# Patient Record
Sex: Female | Born: 1980 | Race: Black or African American | Hispanic: No | Marital: Single | Smoking: Never smoker
Health system: Southern US, Community
[De-identification: ages and names within clinical notes are randomized; demographics above are authoritative.]

## PROBLEM LIST (undated history)

## (undated) HISTORY — PX: ANKLE SURGERY: SHX546

---

## 2004-12-23 ENCOUNTER — Emergency Department (HOSPITAL_COMMUNITY): Admission: EM | Admit: 2004-12-23 | Discharge: 2004-12-23 | Payer: Self-pay | Admitting: Emergency Medicine

## 2005-02-28 ENCOUNTER — Encounter: Admission: RE | Admit: 2005-02-28 | Discharge: 2005-04-06 | Payer: Self-pay | Admitting: Orthopedic Surgery

## 2005-05-17 ENCOUNTER — Ambulatory Visit (HOSPITAL_COMMUNITY): Admission: RE | Admit: 2005-05-17 | Discharge: 2005-05-17 | Payer: Self-pay | Admitting: Orthopedic Surgery

## 2006-09-11 ENCOUNTER — Ambulatory Visit: Payer: Self-pay | Admitting: Internal Medicine

## 2006-09-11 ENCOUNTER — Encounter (INDEPENDENT_AMBULATORY_CARE_PROVIDER_SITE_OTHER): Payer: Self-pay | Admitting: Nurse Practitioner

## 2006-09-11 LAB — CONVERTED CEMR LAB
ALT: <8 units/L (ref 0–35)
AST: 13 units/L (ref 0–37)
Albumin: 4.1 g/dL (ref 3.5–5.2)
Alkaline Phosphatase: 73 units/L (ref 39–117)
BUN: 9 mg/dL (ref 6–23)
Basophils Absolute: 0 10*3/uL (ref 0.0–0.1)
Basophils Relative: 1 % (ref 0–1)
CO2: 25 meq/L (ref 19–32)
Calcium: 9.3 mg/dL (ref 8.4–10.5)
Chloride: 107 meq/L (ref 96–112)
Cholesterol: 157 mg/dL (ref 0–200)
Creatinine, Ser: 0.83 mg/dL (ref 0.40–1.20)
Eosinophils Absolute: 0.1 10*3/uL (ref 0.0–0.7)
Eosinophils Relative: 1 % (ref 0–5)
Glucose, Bld: 68 mg/dL — ABNORMAL LOW (ref 70–99)
HCT: 40.6 % (ref 36.0–46.0)
HDL: 51 mg/dL (ref >39)
Hemoglobin: 12.4 g/dL (ref 12.0–15.0)
LDL Cholesterol: 90 mg/dL (ref 0–99)
Lymphocytes Relative: 24 % (ref 12–46)
Lymphs Abs: 1.5 10*3/uL (ref 0.7–3.3)
MCHC: 30.5 g/dL (ref 30.0–36.0)
MCV: 86.8 fL (ref 78.0–100.0)
Monocytes Absolute: 0.4 10*3/uL (ref 0.2–0.7)
Monocytes Relative: 6 % (ref 3–11)
Neutro Abs: 4.2 10*3/uL (ref 1.7–7.7)
Neutrophils Relative %: 68 % (ref 43–77)
Platelets: 329 10*3/uL (ref 150–400)
Potassium: 4.3 meq/L (ref 3.5–5.3)
RBC: 4.68 M/uL (ref 3.87–5.11)
RDW: 15.6 % — ABNORMAL HIGH (ref 11.5–14.0)
Sodium: 141 meq/L (ref 135–145)
TSH: 0.849 microintl units/mL (ref 0.350–5.50)
Total Bilirubin: 0.3 mg/dL (ref 0.3–1.2)
Total CHOL/HDL Ratio: 3.1
Total Protein: 7.6 g/dL (ref 6.0–8.3)
Triglycerides: 80 mg/dL (ref <150)
VLDL: 16 mg/dL (ref 0–40)
WBC: 6.2 10*3/uL (ref 4.0–10.5)

## 2006-09-13 ENCOUNTER — Ambulatory Visit: Payer: Self-pay | Admitting: Internal Medicine
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
## 2008-02-25 ENCOUNTER — Emergency Department (HOSPITAL_COMMUNITY): Admission: EM | Admit: 2008-02-25 | Discharge: 2008-02-26 | Payer: Self-pay | Admitting: Emergency Medicine

## 2009-12-04 ENCOUNTER — Inpatient Hospital Stay (HOSPITAL_COMMUNITY): Admission: AD | Admit: 2009-12-04 | Discharge: 2009-12-06 | Payer: Self-pay | Admitting: Obstetrics and Gynecology

## 2010-04-05 LAB — CBC
HCT: 26.5 % — ABNORMAL LOW (ref 36.0–46.0)
HCT: 32.7 % — ABNORMAL LOW (ref 36.0–46.0)
Hemoglobin: 10.8 g/dL — ABNORMAL LOW (ref 12.0–15.0)
Hemoglobin: 9 g/dL — ABNORMAL LOW (ref 12.0–15.0)
MCH: 28.9 pg (ref 26.0–34.0)
MCH: 29.5 pg (ref 26.0–34.0)
MCHC: 33 g/dL (ref 30.0–36.0)
MCHC: 34.1 g/dL (ref 30.0–36.0)
MCV: 86.7 fL (ref 78.0–100.0)
MCV: 87.6 fL (ref 78.0–100.0)
Platelets: 169 10*3/uL (ref 150–400)
Platelets: 189 10*3/uL (ref 150–400)
RBC: 3.06 MIL/uL — ABNORMAL LOW (ref 3.87–5.11)
RBC: 3.73 MIL/uL — ABNORMAL LOW (ref 3.87–5.11)
RDW: 13.7 % (ref 11.5–15.5)
RDW: 14.2 % (ref 11.5–15.5)
WBC: 11.3 10*3/uL — ABNORMAL HIGH (ref 4.0–10.5)
WBC: 13.7 10*3/uL — ABNORMAL HIGH (ref 4.0–10.5)

## 2010-04-05 LAB — RPR: RPR Ser Ql: NONREACTIVE

## 2010-07-15 ENCOUNTER — Inpatient Hospital Stay (INDEPENDENT_AMBULATORY_CARE_PROVIDER_SITE_OTHER)
Admission: RE | Admit: 2010-07-15 | Discharge: 2010-07-15 | Disposition: A | Discharge status: Home / Self Care | Payer: Self-pay | Source: Ambulatory Visit | Attending: Family Medicine | Admitting: Family Medicine

## 2010-07-15 DIAGNOSIS — S61209A Unspecified open wound of unspecified finger without damage to nail, initial encounter: Secondary | ICD-10-CM

## 2010-07-18 ENCOUNTER — Inpatient Hospital Stay (HOSPITAL_COMMUNITY)
Admission: RE | Admit: 2010-07-18 | Discharge: 2010-07-18 | Disposition: A | Discharge status: Home / Self Care | Payer: Self-pay | Source: Ambulatory Visit | Attending: Family Medicine | Admitting: Family Medicine

## 2010-08-06 ENCOUNTER — Inpatient Hospital Stay (HOSPITAL_COMMUNITY)
Admission: RE | Admit: 2010-08-06 | Discharge: 2010-08-06 | Disposition: A | Discharge status: Home / Self Care | Payer: Self-pay | Source: Ambulatory Visit | Attending: Family Medicine | Admitting: Family Medicine

## 2011-02-24 ENCOUNTER — Emergency Department (HOSPITAL_COMMUNITY): Admission: EM | Admit: 2011-02-24 | Discharge: 2011-02-24 | Payer: Self-pay

## 2011-11-24 ENCOUNTER — Ambulatory Visit: Payer: Self-pay | Admitting: Obstetrics and Gynecology

## 2014-05-05 ENCOUNTER — Ambulatory Visit (INDEPENDENT_AMBULATORY_CARE_PROVIDER_SITE_OTHER): Payer: No Typology Code available for payment source | Admitting: Family Medicine

## 2014-05-05 VITALS — BP 102/64 | HR 87 | Temp 98.3°F | Resp 16 | Ht 67.0 in | Wt 177.0 lb

## 2014-05-05 DIAGNOSIS — Z113 Encounter for screening for infections with a predominantly sexual mode of transmission: Secondary | ICD-10-CM | POA: Diagnosis not present

## 2014-05-05 DIAGNOSIS — Z130 Encounter for screening for diseases of the blood and blood-forming organs and certain disorders involving the immune mechanism: Secondary | ICD-10-CM

## 2014-05-05 DIAGNOSIS — Z0000 Encounter for general adult medical examination without abnormal findings: Secondary | ICD-10-CM

## 2014-05-05 DIAGNOSIS — Z124 Encounter for screening for malignant neoplasm of cervix: Secondary | ICD-10-CM | POA: Diagnosis not present

## 2014-05-05 DIAGNOSIS — Z111 Encounter for screening for respiratory tuberculosis: Secondary | ICD-10-CM | POA: Diagnosis not present

## 2014-05-05 DIAGNOSIS — Z13220 Encounter for screening for lipoid disorders: Secondary | ICD-10-CM

## 2014-05-05 DIAGNOSIS — Z131 Encounter for screening for diabetes mellitus: Secondary | ICD-10-CM | POA: Diagnosis not present

## 2014-05-05 DIAGNOSIS — Z23 Encounter for immunization: Secondary | ICD-10-CM | POA: Diagnosis not present

## 2014-05-05 NOTE — Progress Notes (Signed)

## 2014-05-05 NOTE — Patient Instructions (Signed)
Good to see you today- I will be in touch with your labs.  I will mail you a copy of your labs unless you want to sign up for mychart  Please come in for your TB readings as planned

## 2014-05-05 NOTE — Progress Notes (Signed)
[facility] [address], [place] [id number] [phone]  Date:  05/05/2014   Name:  Destiny Ramirez   DOB:  [birth year]   MRN:  [id number]  PCP:  Pcp Not In System    Chief Complaint: CPE and PPD Placement   History of Present Illness:  Destiny Ochslexandria E Hebner is a 34 y.o. 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  She would like to have a pap today, and STD testing.   She did have an abnormal pap in 2010, but this was ok on recheck.   She is fasting today Non smoker No other chronic health conditions  She is SA with a female only- there is not chance of pregnancy.  Her LMP was in early March  There are no active problems to display for this patient.   History reviewed. No pertinent past medical history.  History reviewed. No pertinent past surgical history.  History  Substance Use Topics  . Smoking status: Never Smoker   . Smokeless tobacco: Not on file  . Alcohol Use: No    Family History  Problem Relation Age of Onset  . Diabetes Maternal Aunt   . Diabetes Maternal Grandmother     No Known Allergies  Medication list has been reviewed and updated.  No current outpatient prescriptions on file prior to visit.   No current facility-administered medications on file prior to visit.    Review of Systems:  As per HPI- otherwise negative.   Physical Examination: Filed Vitals:   05/05/14 1525  BP: 102/64  Pulse: 87  Temp: 98.3 F (36.8 C)  Resp: 16   Filed Vitals:   05/05/14 1525  Height: 5\' 7"  (1.702 m)  Weight: 177 lb (80.287 kg)   Body mass index is 27.72 kg/(m^2). Ideal Body Weight: Weight in (lb) to have BMI = 25: 159.3  GEN: WDWN, NAD, Non-toxic, A & O x 3 HEENT: Atraumatic, Normocephalic. Neck supple. No masses, No LAD. Ears and Nose: No external deformity. CV: RRR, No M/G/R. No JVD. No thrill. No extra heart  sounds. PULM: CTA B, no wheezes, crackles, rhonchi. No retractions. No resp. distress. No accessory muscle use. ABD: S, NT, ND, +BS. No rebound. No HSM. EXTR: No c/c/e NEURO Normal gait.  PSYCH: Normally interactive. Conversant. Not depressed or anxious appearing.  Calm demeanor.  Breast: normal exam, no masses/ dimpling/ discharge Pelvic: normal, no vaginal lesions or discharge. Uterus normal, no CMT, no adnexal tendereness or masses  Assessment and Plan: Physical exam  Screening for cervical cancer - Plan: Pap IG and Chlamydia/Gonococcus, NAA  Screening for STD (sexually transmitted disease) - Plan: HIV antibody, HSV(herpes simplex vrs) 1+2 ab-IgG, Hepatitis B surface antibody, Hepatitis B surface antigen, Hepatitis C antibody, RPR  Screening-pulmonary TB - Plan: TB Skin Test  Screening for hyperlipidemia - Plan: Lipid panel  Screening for deficiency anemia - Plan: CBC  Screening for diabetes mellitus - Plan: Comprehensive metabolic panel  Immunization due - Plan: Tdap vaccine greater than or equal to 7yo IM  Here today for PE, pap, labs as above.  Also did brief Education officer, environmentalchildcare worker PE form for her and placed PPD She will follow-up for PPD read Will plan further follow- up pending labs.   Signed Abbe AmsterdamJessica Copland, MD

## 2014-05-06 LAB — CBC
HCT: 36.2 % (ref 36.0–46.0)
Hemoglobin: 11.9 g/dL — ABNORMAL LOW (ref 12.0–15.0)
MCH: 26.7 pg (ref 26.0–34.0)
MCHC: 32.9 g/dL (ref 30.0–36.0)
MCV: 81.2 fL (ref 78.0–100.0)
MPV: 10.5 fL (ref 8.6–12.4)
PLATELETS: 360 10*3/uL (ref 150–400)
RBC: 4.46 MIL/uL (ref 3.87–5.11)
RDW: 14.2 % (ref 11.5–15.5)
WBC: 9 10*3/uL (ref 4.0–10.5)

## 2014-05-06 LAB — RPR

## 2014-05-06 LAB — LIPID PANEL
Cholesterol: 171 mg/dL (ref 0–200)
HDL: 58 mg/dL (ref >=46)
LDL Cholesterol: 103 mg/dL — ABNORMAL HIGH (ref 0–99)
Total CHOL/HDL Ratio: 2.9 Ratio
Triglycerides: 49 mg/dL (ref <150)
VLDL: 10 mg/dL (ref 0–40)

## 2014-05-06 LAB — COMPREHENSIVE METABOLIC PANEL
ALT: 8 U/L (ref 0–35)
AST: 16 U/L (ref 0–37)
Albumin: 4.3 g/dL (ref 3.5–5.2)
Alkaline Phosphatase: 67 U/L (ref 39–117)
BUN: 13 mg/dL (ref 6–23)
CO2: 22 meq/L (ref 19–32)
Calcium: 9.3 mg/dL (ref 8.4–10.5)
Chloride: 102 mEq/L (ref 96–112)
Creat: 0.79 mg/dL (ref 0.50–1.10)
Glucose, Bld: 70 mg/dL (ref 70–99)
Potassium: 4 mEq/L (ref 3.5–5.3)
Sodium: 135 mEq/L (ref 135–145)
Total Bilirubin: 0.5 mg/dL (ref 0.2–1.2)
Total Protein: 8.2 g/dL (ref 6.0–8.3)

## 2014-05-06 LAB — PAP IG AND CT-NG NAA
Chlamydia Probe Amp: NEGATIVE
GC Probe Amp: NEGATIVE

## 2014-05-06 LAB — HEPATITIS B SURFACE ANTIBODY, QUANTITATIVE: HEPATITIS B-POST: 1.3 m[IU]/mL

## 2014-05-06 LAB — HIV ANTIBODY (ROUTINE TESTING W REFLEX): HIV 1&2 Ab, 4th Generation: NONREACTIVE

## 2014-05-06 LAB — HEPATITIS B SURFACE ANTIGEN: Hepatitis B Surface Ag: NEGATIVE

## 2014-05-06 LAB — HEPATITIS C ANTIBODY: HCV Ab: NEGATIVE

## 2014-05-07 ENCOUNTER — Encounter: Payer: Self-pay | Admitting: Family Medicine

## 2014-05-07 ENCOUNTER — Ambulatory Visit: Payer: No Typology Code available for payment source

## 2014-05-07 DIAGNOSIS — Z111 Encounter for screening for respiratory tuberculosis: Secondary | ICD-10-CM

## 2014-05-07 LAB — HSV(HERPES SIMPLEX VRS) I + II AB-IGG
HSV 1 Glycoprotein G Ab, IgG: 11.48 IV — ABNORMAL HIGH
HSV 2 GLYCOPROTEIN G AB, IGG: 0.16 IV

## 2014-05-07 LAB — TB SKIN TEST
Induration: 0 mm
TB Skin Test: NEGATIVE

## 2014-05-07 NOTE — Progress Notes (Signed)
   Subjective:    Patient ID: Destiny OchsAlexandria E Maslanka, female    DOB: Jul 13, 1980, 34 y.o.   MRN: [id number]  HPI Pt here for PPD read only. Placed on 05/05/14. PPD negative with 0mm induration.    Review of Systems     Objective:   Physical Exam        Assessment & Plan:

## 2015-03-05 ENCOUNTER — Emergency Department (INDEPENDENT_AMBULATORY_CARE_PROVIDER_SITE_OTHER)
Admission: EM | Admit: 2015-03-05 | Discharge: 2015-03-05 | Disposition: A | Discharge status: Home / Self Care | Payer: Self-pay | Source: Home / Self Care | Attending: Emergency Medicine | Admitting: Emergency Medicine

## 2015-03-05 DIAGNOSIS — J020 Streptococcal pharyngitis: Secondary | ICD-10-CM

## 2015-03-05 MED ORDER — AMOXICILLIN 875 MG PO TABS: 875.0000 mg | ORAL_TABLET | Freq: Two times a day (BID) | ORAL | Status: DC

## 2015-03-05 NOTE — Discharge Instructions (Signed)
You have strep throat. Take amoxicillin twice a day for 10 days. Make sure you complete the entire course. Take ibuprofen as needed for throat pain. You can also do salt water gargles or Chloraseptic spray to help with the pain. You can go back to work on Sunday. Follow-up as needed.

## 2015-03-05 NOTE — ED Provider Notes (Signed)
CSN: [id number]     Arrival date & time 03/05/15  1307 History   First MD Initiated Contact with Patient 03/05/15 1406     No chief complaint on file.  (Consider location/radiation/quality/duration/timing/severity/associated sxs/prior Treatment) HPI  She is a 35 year old woman here for evaluation of sore throat. Her symptoms started yesterday with sore throat, worse with swallowing. She reports some subjective fever and intermittent nausea. No documented temperature. She denies any nasal congestion or rhinorrhea. No cough or shortness of breath. On was diagnosed and treated for strep throat 2 weeks ago. She states around that time she had some throat pain and took some of his antibiotic which resolved the pain.  No past medical history on file. No past surgical history on file. Family History  Problem Relation Age of Onset  . Diabetes Maternal Aunt   . Diabetes Maternal Grandmother    Social History  Substance Use Topics  . Smoking status: Never Smoker   . Smokeless tobacco: Not on file  . Alcohol Use: No   OB History    No data available     Review of Systems As in history of present illness Allergies  Review of patient's allergies indicates no known allergies.  Home Medications   Prior to Admission medications   Medication Sig Start Date End Date Taking? Authorizing Provider  amoxicillin (AMOXIL) 875 MG tablet Take 1 tablet (875 mg total) by mouth 2 (two) times daily. 03/05/15   Charm Rings, MD  loratadine (CLARITIN) 10 MG tablet Take 10 mg by mouth daily.    Historical Provider, MD   Meds Ordered and Administered this Visit  Medications - No data to display  BP 111/74 mmHg  Pulse 74  Temp(Src) 98.3 F (36.8 C) (Oral)  Resp 16  SpO2 100% No data found.   Physical Exam  Constitutional: She is oriented to person, place, and time. She appears well-developed and well-nourished. No distress.  HENT:  Nose: Nose normal.  Mouth/Throat: Oropharyngeal exudate present.   Tonsils are erythematous and swollen.  Neck: Neck supple.  Cardiovascular: Regular rhythm and normal heart sounds.   No murmur heard. Pulmonary/Chest: Effort normal and breath sounds normal. No respiratory distress. She has no wheezes. She has no rales.  Lymphadenopathy:    She has cervical adenopathy.  Neurological: She is alert and oriented to person, place, and time.    ED Course  Procedures (including critical care time)  Labs Review Labs Reviewed - No data to display  Imaging Review No results found.    MDM   1. Strep pharyngitis    Given known contact and partial treatment previously, will skip the rapid strep test and throat culture. Treat with amoxicillin for 10 days. Discussed importance of finishing the entire course. Work note given. Follow-up as needed.    Charm Rings, MD 03/05/15 1435

## 2015-04-05 ENCOUNTER — Ambulatory Visit: Payer: No Typology Code available for payment source | Admitting: Podiatry

## 2019-09-16 ENCOUNTER — Other Ambulatory Visit: Payer: Medicaid Other

## 2019-09-16 ENCOUNTER — Other Ambulatory Visit: Payer: Self-pay

## 2019-09-16 DIAGNOSIS — Z20822 Contact with and (suspected) exposure to covid-19: Secondary | ICD-10-CM

## 2019-09-17 LAB — SARS-COV-2, NAA 2 DAY TAT

## 2019-09-17 LAB — NOVEL CORONAVIRUS, NAA: SARS-CoV-2, NAA: NOT DETECTED

## 2019-09-18 ENCOUNTER — Telehealth: Payer: Self-pay | Admitting: *Deleted

## 2019-09-18 NOTE — Telephone Encounter (Signed)
While providing her son's positive Covid result, reviewed her negative Covid results.

## 2019-10-28 ENCOUNTER — Other Ambulatory Visit: Payer: Self-pay | Admitting: Orthopedic Surgery

## 2019-10-28 ENCOUNTER — Other Ambulatory Visit: Payer: Self-pay

## 2019-10-28 ENCOUNTER — Encounter (HOSPITAL_BASED_OUTPATIENT_CLINIC_OR_DEPARTMENT_OTHER): Payer: Self-pay | Admitting: Orthopedic Surgery

## 2019-10-31 ENCOUNTER — Other Ambulatory Visit (HOSPITAL_COMMUNITY)
Admission: RE | Admit: 2019-10-31 | Discharge: 2019-10-31 | Disposition: A | Discharge status: Home / Self Care | Payer: Medicaid Other | Source: Ambulatory Visit | Attending: Orthopedic Surgery | Admitting: Orthopedic Surgery

## 2019-10-31 DIAGNOSIS — Z01812 Encounter for preprocedural laboratory examination: Secondary | ICD-10-CM | POA: Insufficient documentation

## 2019-10-31 DIAGNOSIS — Z20822 Contact with and (suspected) exposure to covid-19: Secondary | ICD-10-CM | POA: Diagnosis not present

## 2019-10-31 LAB — SARS CORONAVIRUS 2 (TAT 6-24 HRS): SARS Coronavirus 2: NEGATIVE

## 2019-10-31 NOTE — Progress Notes (Signed)
  Pt to finish drinking ensure presurgery drink by 0900 DOS        Enhanced Recovery after Surgery for Orthopedics Enhanced Recovery after Surgery is a protocol used to improve the stress on your body and your recovery after surgery.  Patient Instructions  . The night before surgery:  o No food after midnight. ONLY clear liquids after midnight  . The day of surgery (if you do NOT have diabetes):  o Drink ONE (1) Pre-Surgery Clear Ensure as directed.   o This drink was given to you during your hospital  pre-op appointment visit. o The pre-op nurse will instruct you on the time to drink the  Pre-Surgery Ensure depending on your surgery time. o Finish the drink at the designated time by the pre-op nurse.  o Nothing else to drink after completing the  Pre-Surgery Clear Ensure.  . The day of surgery (if you have diabetes): o Drink ONE (1) Gatorade 2 (G2) as directed. o This drink was given to you during your hospital  pre-op appointment visit.  o The pre-op nurse will instruct you on the time to drink the   Gatorade 2 (G2) depending on your surgery time. o Color of the Gatorade may vary. Red is not allowed. o Nothing else to drink after completing the  Gatorade 2 (G2).         If you have questions, please contact your surgeon's office.

## 2019-11-03 ENCOUNTER — Other Ambulatory Visit: Payer: Self-pay | Admitting: Orthopedic Surgery

## 2019-11-04 ENCOUNTER — Other Ambulatory Visit: Payer: Self-pay

## 2019-11-04 ENCOUNTER — Encounter (HOSPITAL_BASED_OUTPATIENT_CLINIC_OR_DEPARTMENT_OTHER)
Admission: RE | Disposition: A | Discharge status: Home / Self Care | Payer: Self-pay | Source: Home / Self Care | Attending: Orthopedic Surgery

## 2019-11-04 ENCOUNTER — Encounter (HOSPITAL_BASED_OUTPATIENT_CLINIC_OR_DEPARTMENT_OTHER): Payer: Self-pay | Admitting: Orthopedic Surgery

## 2019-11-04 ENCOUNTER — Ambulatory Visit (HOSPITAL_BASED_OUTPATIENT_CLINIC_OR_DEPARTMENT_OTHER)
Admission: RE | Admit: 2019-11-04 | Discharge: 2019-11-04 | Disposition: A | Discharge status: Home / Self Care | Payer: Medicaid Other | Attending: Orthopedic Surgery | Admitting: Orthopedic Surgery

## 2019-11-04 ENCOUNTER — Ambulatory Visit (HOSPITAL_BASED_OUTPATIENT_CLINIC_OR_DEPARTMENT_OTHER): Payer: Medicaid Other | Admitting: Anesthesiology

## 2019-11-04 ENCOUNTER — Other Ambulatory Visit: Payer: Self-pay | Admitting: Orthopedic Surgery

## 2019-11-04 DIAGNOSIS — X58XXXA Exposure to other specified factors, initial encounter: Secondary | ICD-10-CM | POA: Insufficient documentation

## 2019-11-04 DIAGNOSIS — Z79899 Other long term (current) drug therapy: Secondary | ICD-10-CM | POA: Insufficient documentation

## 2019-11-04 DIAGNOSIS — S62336A Displaced fracture of neck of fifth metacarpal bone, right hand, initial encounter for closed fracture: Secondary | ICD-10-CM | POA: Insufficient documentation

## 2019-11-04 HISTORY — PX: CLOSED REDUCTION FINGER WITH PERCUTANEOUS PINNING: SHX5612

## 2019-11-04 LAB — POCT PREGNANCY, URINE: Preg Test, Ur: NEGATIVE

## 2019-11-04 SURGERY — CLOSED REDUCTION, FINGER, WITH PERCUTANEOUS PINNING
Anesthesia: Monitor Anesthesia Care | Site: Finger | Laterality: Right

## 2019-11-04 MED ORDER — CLONIDINE HCL (ANALGESIA) 100 MCG/ML EP SOLN
EPIDURAL | Status: DC | PRN
  Administered 2019-11-04: 5 ug

## 2019-11-04 MED ORDER — PROPOFOL 500 MG/50ML IV EMUL
INTRAVENOUS | Status: DC | PRN
  Administered 2019-11-04: 100 ug/kg/min via INTRAVENOUS

## 2019-11-04 MED ORDER — FENTANYL CITRATE (PF) 100 MCG/2ML IJ SOLN: 25.0000 ug | INTRAMUSCULAR | Status: DC | PRN

## 2019-11-04 MED ORDER — FENTANYL CITRATE (PF) 100 MCG/2ML IJ SOLN
INTRAMUSCULAR | Status: AC
  Filled 2019-11-04: qty 2

## 2019-11-04 MED ORDER — LACTATED RINGERS IV SOLN: INTRAVENOUS | Status: DC

## 2019-11-04 MED ORDER — OXYCODONE HCL 5 MG PO TABS: 5.0000 mg | ORAL_TABLET | Freq: Once | ORAL | Status: DC | PRN

## 2019-11-04 MED ORDER — DEXAMETHASONE SODIUM PHOSPHATE 10 MG/ML IJ SOLN
INTRAMUSCULAR | Status: DC | PRN
  Administered 2019-11-04: 5 mg

## 2019-11-04 MED ORDER — ONDANSETRON HCL 4 MG/2ML IJ SOLN: 4.0000 mg | Freq: Once | INTRAMUSCULAR | Status: DC | PRN

## 2019-11-04 MED ORDER — FENTANYL CITRATE (PF) 100 MCG/2ML IJ SOLN
100.0000 ug | Freq: Once | INTRAMUSCULAR | Status: AC
  Administered 2019-11-04: 100 ug via INTRAVENOUS

## 2019-11-04 MED ORDER — ONDANSETRON HCL 4 MG/2ML IJ SOLN
INTRAMUSCULAR | Status: DC | PRN
  Administered 2019-11-04: 4 mg via INTRAVENOUS

## 2019-11-04 MED ORDER — OXYCODONE HCL 5 MG/5ML PO SOLN: 5.0000 mg | Freq: Once | ORAL | Status: DC | PRN

## 2019-11-04 MED ORDER — AMISULPRIDE (ANTIEMETIC) 5 MG/2ML IV SOLN: 10.0000 mg | Freq: Once | INTRAVENOUS | Status: DC | PRN

## 2019-11-04 MED ORDER — MIDAZOLAM HCL 2 MG/2ML IJ SOLN
INTRAMUSCULAR | Status: AC
  Filled 2019-11-04: qty 2

## 2019-11-04 MED ORDER — CEFAZOLIN SODIUM-DEXTROSE 2-4 GM/100ML-% IV SOLN
2.0000 g | INTRAVENOUS | Status: AC
  Administered 2019-11-04: 2 g via INTRAVENOUS

## 2019-11-04 MED ORDER — DIPHENHYDRAMINE HCL 50 MG/ML IJ SOLN
INTRAMUSCULAR | Status: DC | PRN
  Administered 2019-11-04: 12.5 mg via INTRAVENOUS

## 2019-11-04 MED ORDER — ROPIVACAINE HCL 5 MG/ML IJ SOLN
INTRAMUSCULAR | Status: DC | PRN
  Administered 2019-11-04: 30 mL via PERINEURAL

## 2019-11-04 MED ORDER — MIDAZOLAM HCL 2 MG/2ML IJ SOLN
2.0000 mg | Freq: Once | INTRAMUSCULAR | Status: AC
  Administered 2019-11-04: 2 mg via INTRAVENOUS

## 2019-11-04 MED ORDER — HYDROCODONE-ACETAMINOPHEN 5-325 MG PO TABS
ORAL_TABLET | ORAL | 0 refills | Status: DC
Start: 2019-11-04 — End: 2021-03-24

## 2019-11-04 SURGICAL SUPPLY — 51 items
APL PRP STRL LF DISP 70% ISPRP (MISCELLANEOUS) ×1
BLADE SURG 15 STRL LF DISP TIS (BLADE) ×2 IMPLANT
BLADE SURG 15 STRL SS (BLADE) ×6
BNDG CMPR 9X4 STRL LF SNTH (GAUZE/BANDAGES/DRESSINGS) ×1
BNDG ELASTIC 3X5.8 VLCR STR LF (GAUZE/BANDAGES/DRESSINGS) ×3 IMPLANT
BNDG ESMARK 4X9 LF (GAUZE/BANDAGES/DRESSINGS) ×3 IMPLANT
BNDG GAUZE ELAST 4 BULKY (GAUZE/BANDAGES/DRESSINGS) ×3 IMPLANT
CHLORAPREP W/TINT 26 (MISCELLANEOUS) ×3 IMPLANT
CORD BIPOLAR FORCEPS 12FT (ELECTRODE) ×3 IMPLANT
COVER BACK TABLE 60X90IN (DRAPES) ×3 IMPLANT
COVER MAYO STAND STRL (DRAPES) ×3 IMPLANT
COVER WAND RF STERILE (DRAPES) IMPLANT
CUFF TOURN SGL QUICK 18X4 (TOURNIQUET CUFF) ×3 IMPLANT
DRAPE EXTREMITY T 121X128X90 (DISPOSABLE) ×3 IMPLANT
DRAPE OEC MINIVIEW 54X84 (DRAPES) ×3 IMPLANT
DRAPE SURG 17X23 STRL (DRAPES) ×3 IMPLANT
GAUZE SPONGE 4X4 12PLY STRL (GAUZE/BANDAGES/DRESSINGS) ×3 IMPLANT
GAUZE XEROFORM 1X8 LF (GAUZE/BANDAGES/DRESSINGS) ×3 IMPLANT
GLOVE BIO SURGEON STRL SZ 6.5 (GLOVE) ×4 IMPLANT
GLOVE BIO SURGEON STRL SZ7.5 (GLOVE) ×3 IMPLANT
GLOVE BIO SURGEONS STRL SZ 6.5 (GLOVE) ×2
GLOVE BIOGEL PI IND STRL 7.0 (GLOVE) ×1 IMPLANT
GLOVE BIOGEL PI IND STRL 8 (GLOVE) ×1 IMPLANT
GLOVE BIOGEL PI INDICATOR 7.0 (GLOVE) ×2
GLOVE BIOGEL PI INDICATOR 8 (GLOVE) ×2
GOWN STRL REUS W/ TWL LRG LVL3 (GOWN DISPOSABLE) ×1 IMPLANT
GOWN STRL REUS W/TWL LRG LVL3 (GOWN DISPOSABLE) ×3
GOWN STRL REUS W/TWL XL LVL3 (GOWN DISPOSABLE) ×3 IMPLANT
K-WIRE .035X4 (WIRE) ×12 IMPLANT
NEEDLE HYPO 25X1 1.5 SAFETY (NEEDLE) IMPLANT
NS IRRIG 1000ML POUR BTL (IV SOLUTION) ×3 IMPLANT
PACK BASIN DAY SURGERY FS (CUSTOM PROCEDURE TRAY) ×3 IMPLANT
PAD CAST 4YDX4 CTTN HI CHSV (CAST SUPPLIES) ×1 IMPLANT
PADDING CAST COTTON 4X4 STRL (CAST SUPPLIES) ×3
SLEEVE SCD COMPRESS KNEE MED (MISCELLANEOUS) IMPLANT
SPLINT PLASTER CAST XFAST 3X15 (CAST SUPPLIES) ×10 IMPLANT
SPLINT PLASTER CAST XFAST 4X15 (CAST SUPPLIES) IMPLANT
SPLINT PLASTER XTRA FAST SET 4 (CAST SUPPLIES)
SPLINT PLASTER XTRA FASTSET 3X (CAST SUPPLIES) ×20
STOCKINETTE 4X48 STRL (DRAPES) ×3 IMPLANT
SUT CHROMIC 4 0 PS 2 18 (SUTURE) ×3 IMPLANT
SUT ETHILON 3 0 PS 1 (SUTURE) IMPLANT
SUT ETHILON 4 0 PS 2 18 (SUTURE) ×3 IMPLANT
SUT MERSILENE 4 0 P 3 (SUTURE) IMPLANT
SUT VIC AB 3-0 PS1 18 (SUTURE)
SUT VIC AB 3-0 PS1 18XBRD (SUTURE) IMPLANT
SUT VICRYL 4-0 PS2 18IN ABS (SUTURE) ×3 IMPLANT
SYR BULB EAR ULCER 3OZ GRN STR (SYRINGE) ×3 IMPLANT
SYR CONTROL 10ML LL (SYRINGE) IMPLANT
TOWEL GREEN STERILE FF (TOWEL DISPOSABLE) ×6 IMPLANT
UNDERPAD 30X36 HEAVY ABSORB (UNDERPADS AND DIAPERS) ×3 IMPLANT

## 2019-11-04 NOTE — Progress Notes (Signed)
Assisted Dr. Witman with right, ultrasound guided, supraclavicular block. Side rails up, monitors on throughout procedure. See vital signs in flow sheet. Tolerated Procedure well. 

## 2019-11-04 NOTE — Anesthesia Procedure Notes (Signed)
Anesthesia Regional Block: Supraclavicular block   Pre-Anesthetic Checklist: ,, timeout performed, Correct Patient, Correct Site, Correct Laterality, Correct Procedure, Correct Position, site marked, Risks and benefits discussed,  Surgical consent,  Pre-op evaluation,  At surgeon's request and post-op pain management  Laterality: Right  Prep: chloraprep       Needles:  Injection technique: Single-shot  Needle Type: Echogenic Stimulator Needle     Needle Length: 10cm  Needle Gauge: 20     Additional Needles:   Procedures:,,,, ultrasound used (permanent image in chart),,,,  Narrative:  Start time: 11/04/2019 12:50 PM End time: 11/04/2019 12:56 PM Injection made incrementally with aspirations every 5 mL.  Performed by: Personally  Anesthesiologist: Lucretia Kern, MD  Additional Notes: Standard monitors applied. Skin prepped. Good needle visualization with ultrasound. Injection made in 5cc increments with no resistance to injection. Patient tolerated the procedure well.

## 2019-11-04 NOTE — Transfer of Care (Signed)
Immediate Anesthesia Transfer of Care Note  Patient: Destiny Ramirez  Procedure(s) Performed: CLOSED REDUCTION PERCUTANEOUS PINNING RIGHT SMALL METACARPAL (Right Finger)  Patient Location: PACU  Anesthesia Type:MAC and Bier block  Level of Consciousness: awake, alert , oriented, drowsy and patient cooperative  Airway & Oxygen Therapy: Patient Spontanous Breathing and Patient connected to face mask oxygen  Post-op Assessment: Report given to RN and Post -op Vital signs reviewed and stable  Post vital signs: Reviewed  Last Vitals:  Vitals Value Taken Time  BP 120/61 11/04/19 1430  Temp    Pulse 97 11/04/19 1430  Resp 22 11/04/19 1431  SpO2 96 % 11/04/19 1430  Vitals shown include unvalidated device data.  Last Pain:  Vitals:   11/04/19 1159  TempSrc: Oral  PainSc: 0-No pain         Complications: No complications documented.

## 2019-11-04 NOTE — Anesthesia Postprocedure Evaluation (Signed)
Anesthesia Post Note  Patient: Destiny Ramirez  Procedure(s) Performed: CLOSED REDUCTION PERCUTANEOUS PINNING RIGHT SMALL METACARPAL (Right Finger)     Patient location during evaluation: PACU Anesthesia Type: Regional Level of consciousness: awake and alert Pain management: pain level controlled Vital Signs Assessment: post-procedure vital signs reviewed and stable Respiratory status: spontaneous breathing, nonlabored ventilation and respiratory function stable Cardiovascular status: blood pressure returned to baseline and stable Postop Assessment: no apparent nausea or vomiting Anesthetic complications: no   No complications documented.  Last Vitals:  Vitals:   11/04/19 1445 11/04/19 1500  BP: (!) 137/56 (!) 128/56  Pulse: 60 (!) 50  Resp: (!) 22 17  Temp:    SpO2: 100% 99%    Last Pain:  Vitals:   11/04/19 1530  TempSrc:   PainSc: 0-No pain                 Lucretia Kern
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
## 2019-11-04 NOTE — Discharge Instructions (Addendum)
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
## 2019-11-04 NOTE — Op Note (Signed)
I assisted Surgeon(s) and Role:    * Betha Loa, MD - Primary    Cindee Salt, MD on the Procedure(s): CLOSED REDUCTION PERCUTANEOUS PINNING RIGHT SMALL METACARPAL on 11/04/2019.  I provided assistance on this case as follows:setup, manipulation, reduction, stabilization, and fixation of the fracture, and application of the dressings and splints.  Electronically signed by: Cindee Salt, MD Date: 11/04/2019 Time: 2:22 PM

## 2019-11-04 NOTE — Anesthesia Preprocedure Evaluation (Addendum)
Anesthesia Evaluation  Patient identified by MRN, date of birth, ID band Patient awake    Reviewed: Allergy & Precautions, NPO status , Patient's Chart, lab work & pertinent test results  History of Anesthesia Complications Negative for: history of anesthetic complications  Airway Mallampati: II  TM Distance: >3 FB Neck ROM: Full    Dental  (+) Teeth Intact   Pulmonary neg pulmonary ROS,    Pulmonary exam normal        Cardiovascular negative cardio ROS Normal cardiovascular exam     Neuro/Psych negative neurological ROS  negative psych ROS   GI/Hepatic negative GI ROS, Neg liver ROS,   Endo/Other  negative endocrine ROS  Renal/GU negative Renal ROS  negative genitourinary   Musculoskeletal negative musculoskeletal ROS (+)   Abdominal   Peds  Hematology negative hematology ROS (+)   Anesthesia Other Findings   Reproductive/Obstetrics                            Anesthesia Physical Anesthesia Plan  ASA: I  Anesthesia Plan: MAC and Regional   Post-op Pain Management:  Regional for Post-op pain   Induction: Intravenous  PONV Risk Score and Plan: 2 and Propofol infusion, TIVA and Treatment may vary due to age or medical condition  Airway Management Planned: Natural Airway, Nasal Cannula and Simple Face Mask  Additional Equipment: None  Intra-op Plan:   Post-operative Plan:   Informed Consent: I have reviewed the patients History and Physical, chart, labs and discussed the procedure including the risks, benefits and alternatives for the proposed anesthesia with the patient or authorized representative who has indicated his/her understanding and acceptance.       Plan Discussed with:   Anesthesia Plan Comments:         Anesthesia Quick Evaluation

## 2019-11-05 ENCOUNTER — Encounter (HOSPITAL_BASED_OUTPATIENT_CLINIC_OR_DEPARTMENT_OTHER): Payer: Self-pay | Admitting: Orthopedic Surgery

## 2021-02-23 ENCOUNTER — Other Ambulatory Visit: Payer: Self-pay | Admitting: Orthopedic Surgery

## 2021-03-10 ENCOUNTER — Other Ambulatory Visit: Payer: Self-pay | Admitting: Internal Medicine

## 2021-03-11 LAB — C. TRACHOMATIS/N. GONORRHOEAE RNA
C. trachomatis RNA, TMA: NOT DETECTED
N. gonorrhoeae RNA, TMA: NOT DETECTED

## 2021-03-16 ENCOUNTER — Other Ambulatory Visit: Payer: Self-pay

## 2021-03-16 ENCOUNTER — Encounter (HOSPITAL_BASED_OUTPATIENT_CLINIC_OR_DEPARTMENT_OTHER): Payer: Self-pay | Admitting: Orthopedic Surgery

## 2021-03-24 ENCOUNTER — Encounter (HOSPITAL_BASED_OUTPATIENT_CLINIC_OR_DEPARTMENT_OTHER): Payer: Self-pay | Admitting: Orthopedic Surgery

## 2021-03-24 ENCOUNTER — Other Ambulatory Visit: Payer: Self-pay

## 2021-03-24 ENCOUNTER — Ambulatory Visit (HOSPITAL_BASED_OUTPATIENT_CLINIC_OR_DEPARTMENT_OTHER): Payer: Medicaid Other | Admitting: Anesthesiology

## 2021-03-24 ENCOUNTER — Ambulatory Visit (HOSPITAL_BASED_OUTPATIENT_CLINIC_OR_DEPARTMENT_OTHER)
Admission: RE | Admit: 2021-03-24 | Discharge: 2021-03-24 | Disposition: A | Discharge status: Home / Self Care | Payer: Medicaid Other | Attending: Orthopedic Surgery | Admitting: Orthopedic Surgery

## 2021-03-24 ENCOUNTER — Encounter (HOSPITAL_BASED_OUTPATIENT_CLINIC_OR_DEPARTMENT_OTHER)
Admission: RE | Disposition: A | Discharge status: Home / Self Care | Payer: Self-pay | Source: Home / Self Care | Attending: Orthopedic Surgery

## 2021-03-24 ENCOUNTER — Ambulatory Visit (HOSPITAL_BASED_OUTPATIENT_CLINIC_OR_DEPARTMENT_OTHER): Payer: Medicaid Other

## 2021-03-24 DIAGNOSIS — M24541 Contracture, right hand: Secondary | ICD-10-CM | POA: Insufficient documentation

## 2021-03-24 LAB — POCT PREGNANCY, URINE: Preg Test, Ur: NEGATIVE

## 2021-03-24 SURGERY — RECONSTRUCTION, LIGAMENT, COLLATERAL, MCP JOINT
Anesthesia: General | Site: Hand | Laterality: Right

## 2021-03-24 MED ORDER — SCOPOLAMINE 1 MG/3DAYS TD PT72
1.0000 | MEDICATED_PATCH | TRANSDERMAL | Status: DC
  Administered 2021-03-24: 1.5 mg via TRANSDERMAL

## 2021-03-24 MED ORDER — ONDANSETRON HCL 4 MG/2ML IJ SOLN
4.0000 mg | Freq: Once | INTRAMUSCULAR | Status: AC | PRN
  Administered 2021-03-24: 4 mg via INTRAVENOUS

## 2021-03-24 MED ORDER — OXYCODONE HCL 5 MG/5ML PO SOLN: 5.0000 mg | Freq: Once | ORAL | Status: DC | PRN

## 2021-03-24 MED ORDER — MIDAZOLAM HCL 2 MG/2ML IJ SOLN
INTRAMUSCULAR | Status: AC
  Filled 2021-03-24: qty 2

## 2021-03-24 MED ORDER — MIDAZOLAM HCL 5 MG/5ML IJ SOLN
INTRAMUSCULAR | Status: DC | PRN
Start: 2021-03-24 — End: 2021-03-24
  Administered 2021-03-24: 2 mg via INTRAVENOUS

## 2021-03-24 MED ORDER — CEFAZOLIN SODIUM-DEXTROSE 2-4 GM/100ML-% IV SOLN
2.0000 g | INTRAVENOUS | Status: AC
  Administered 2021-03-24: 2 g via INTRAVENOUS

## 2021-03-24 MED ORDER — FENTANYL CITRATE (PF) 100 MCG/2ML IJ SOLN
INTRAMUSCULAR | Status: DC | PRN
  Administered 2021-03-24 (×2): 50 ug via INTRAVENOUS

## 2021-03-24 MED ORDER — DEXAMETHASONE SODIUM PHOSPHATE 4 MG/ML IJ SOLN
INTRAMUSCULAR | Status: DC | PRN
  Administered 2021-03-24: 8 mg via INTRAVENOUS

## 2021-03-24 MED ORDER — HYDROMORPHONE HCL 1 MG/ML IJ SOLN
INTRAMUSCULAR | Status: AC
  Filled 2021-03-24: qty 0.5

## 2021-03-24 MED ORDER — CEFAZOLIN SODIUM-DEXTROSE 2-4 GM/100ML-% IV SOLN
INTRAVENOUS | Status: AC
  Filled 2021-03-24: qty 100

## 2021-03-24 MED ORDER — ACETAMINOPHEN 500 MG PO TABS
ORAL_TABLET | ORAL | Status: AC
  Filled 2021-03-24: qty 2

## 2021-03-24 MED ORDER — PROPOFOL 10 MG/ML IV BOLUS
INTRAVENOUS | Status: AC
  Filled 2021-03-24: qty 20

## 2021-03-24 MED ORDER — BUPIVACAINE HCL (PF) 0.25 % IJ SOLN
INTRAMUSCULAR | Status: DC | PRN
Start: 2021-03-24 — End: 2021-03-24
  Administered 2021-03-24: 9 mL

## 2021-03-24 MED ORDER — HYDROMORPHONE HCL 1 MG/ML IJ SOLN
0.5000 mg | INTRAMUSCULAR | Status: DC | PRN
  Administered 2021-03-24 (×2): 0.5 mg via INTRAVENOUS

## 2021-03-24 MED ORDER — FENTANYL CITRATE (PF) 100 MCG/2ML IJ SOLN
INTRAMUSCULAR | Status: AC
  Filled 2021-03-24: qty 2

## 2021-03-24 MED ORDER — OXYCODONE HCL 5 MG PO TABS: 5.0000 mg | ORAL_TABLET | Freq: Once | ORAL | Status: DC | PRN

## 2021-03-24 MED ORDER — LACTATED RINGERS IV SOLN: INTRAVENOUS | Status: DC

## 2021-03-24 MED ORDER — LIDOCAINE 2% (20 MG/ML) 5 ML SYRINGE
INTRAMUSCULAR | Status: AC
  Filled 2021-03-24: qty 5

## 2021-03-24 MED ORDER — FENTANYL CITRATE (PF) 100 MCG/2ML IJ SOLN
25.0000 ug | INTRAMUSCULAR | Status: DC | PRN
  Administered 2021-03-24 (×2): 50 ug via INTRAVENOUS
  Administered 2021-03-24 (×2): 25 ug via INTRAVENOUS

## 2021-03-24 MED ORDER — PROPOFOL 10 MG/ML IV BOLUS
INTRAVENOUS | Status: DC | PRN
  Administered 2021-03-24: 150 mg via INTRAVENOUS

## 2021-03-24 MED ORDER — AMISULPRIDE (ANTIEMETIC) 5 MG/2ML IV SOLN: 10.0000 mg | Freq: Once | INTRAVENOUS | Status: DC | PRN

## 2021-03-24 MED ORDER — LIDOCAINE 2% (20 MG/ML) 5 ML SYRINGE
INTRAMUSCULAR | Status: DC | PRN
  Administered 2021-03-24: 60 mg via INTRAVENOUS

## 2021-03-24 MED ORDER — BUPIVACAINE HCL (PF) 0.25 % IJ SOLN
INTRAMUSCULAR | Status: AC
  Filled 2021-03-24: qty 30

## 2021-03-24 MED ORDER — HYDROCODONE-ACETAMINOPHEN 5-325 MG PO TABS: ORAL_TABLET | ORAL | 0 refills | Status: AC

## 2021-03-24 MED ORDER — SCOPOLAMINE 1 MG/3DAYS TD PT72
MEDICATED_PATCH | TRANSDERMAL | Status: AC
  Filled 2021-03-24: qty 1

## 2021-03-24 MED ORDER — ACETAMINOPHEN 500 MG PO TABS
1000.0000 mg | ORAL_TABLET | Freq: Once | ORAL | Status: AC
  Administered 2021-03-24: 1000 mg via ORAL

## 2021-03-24 SURGICAL SUPPLY — 45 items
APL PRP STRL LF DISP 70% ISPRP (MISCELLANEOUS) ×1
BLADE MINI RND TIP GREEN BEAV (BLADE) ×1 IMPLANT
BLADE SURG 15 STRL LF DISP TIS (BLADE) ×2 IMPLANT
BLADE SURG 15 STRL SS (BLADE) ×4
BNDG CMPR 9X4 STRL LF SNTH (GAUZE/BANDAGES/DRESSINGS) ×1
BNDG ELASTIC 3X5.8 VLCR STR LF (GAUZE/BANDAGES/DRESSINGS) ×1 IMPLANT
BNDG ESMARK 4X9 LF (GAUZE/BANDAGES/DRESSINGS) ×1 IMPLANT
BNDG GAUZE ELAST 4 BULKY (GAUZE/BANDAGES/DRESSINGS) ×2 IMPLANT
CHLORAPREP W/TINT 26 (MISCELLANEOUS) ×2 IMPLANT
CORD BIPOLAR FORCEPS 12FT (ELECTRODE) ×2 IMPLANT
COVER BACK TABLE 60X90IN (DRAPES) ×2 IMPLANT
COVER MAYO STAND STRL (DRAPES) ×2 IMPLANT
CUFF TOURN SGL QUICK 18X4 (TOURNIQUET CUFF) ×1 IMPLANT
DRAPE EXTREMITY T 121X128X90 (DISPOSABLE) ×2 IMPLANT
DRAPE OEC MINIVIEW 54X84 (DRAPES) ×1 IMPLANT
DRAPE SURG 17X23 STRL (DRAPES) ×1 IMPLANT
DRSG PAD ABDOMINAL 8X10 ST (GAUZE/BANDAGES/DRESSINGS) ×1 IMPLANT
GAUZE SPONGE 4X4 12PLY STRL (GAUZE/BANDAGES/DRESSINGS) ×2 IMPLANT
GAUZE XEROFORM 1X8 LF (GAUZE/BANDAGES/DRESSINGS) ×2 IMPLANT
GLOVE SRG 8 PF TXTR STRL LF DI (GLOVE) ×1 IMPLANT
GLOVE SURG ENC MOIS LTX SZ7.5 (GLOVE) ×3 IMPLANT
GLOVE SURG POLYISO LF SZ7 (GLOVE) ×1 IMPLANT
GLOVE SURG UNDER POLY LF SZ7 (GLOVE) ×3 IMPLANT
GLOVE SURG UNDER POLY LF SZ8 (GLOVE) ×4
GOWN STRL REUS W/ TWL LRG LVL3 (GOWN DISPOSABLE) ×1 IMPLANT
GOWN STRL REUS W/TWL LRG LVL3 (GOWN DISPOSABLE) ×2
GOWN STRL REUS W/TWL XL LVL3 (GOWN DISPOSABLE) ×3 IMPLANT
NDL HYPO 25X1 1.5 SAFETY (NEEDLE) ×1 IMPLANT
NEEDLE HYPO 25X1 1.5 SAFETY (NEEDLE) ×2 IMPLANT
NS IRRIG 1000ML POUR BTL (IV SOLUTION) ×2 IMPLANT
PACK BASIN DAY SURGERY FS (CUSTOM PROCEDURE TRAY) ×2 IMPLANT
PAD CAST 3X4 CTTN HI CHSV (CAST SUPPLIES) IMPLANT
PADDING CAST ABS 4INX4YD NS (CAST SUPPLIES)
PADDING CAST ABS COTTON 4X4 ST (CAST SUPPLIES) ×1 IMPLANT
PADDING CAST COTTON 3X4 STRL (CAST SUPPLIES) ×2
SPLINT PLASTER CAST XFAST 3X15 (CAST SUPPLIES) IMPLANT
SPLINT PLASTER XTRA FASTSET 3X (CAST SUPPLIES) ×10
STOCKINETTE 4X48 STRL (DRAPES) ×2 IMPLANT
SUT CHROMIC 4 0 P 3 18 (SUTURE) ×1 IMPLANT
SUT ETHILON 4 0 PS 2 18 (SUTURE) ×2 IMPLANT
SUT MERSILENE 4 0 P 3 (SUTURE) ×1 IMPLANT
SYR BULB EAR ULCER 3OZ GRN STR (SYRINGE) ×2 IMPLANT
SYR CONTROL 10ML LL (SYRINGE) ×2 IMPLANT
TOWEL GREEN STERILE FF (TOWEL DISPOSABLE) ×3 IMPLANT
UNDERPAD 30X36 HEAVY ABSORB (UNDERPADS AND DIAPERS) ×2 IMPLANT

## 2021-03-24 NOTE — Op Note (Signed)
NAME: Destiny Ramirez ?MEDICAL RECORD NO: [id number] ?DATE OF BIRTH: 10/16/1980 ?FACILITY: [facility] ?LOCATION: [facility] ?PHYSICIAN: Tami Ribas, MD ?  ?OPERATIVE REPORT ?  ?DATE OF PROCEDURE: 03/24/21  ?  ?PREOPERATIVE DIAGNOSIS: Right small finger MP joint contracture/stiffness ?  ?POSTOPERATIVE DIAGNOSIS: Right small finger MP joint contracture/stiffness ?  ?PROCEDURE: Right small finger MP joint release ?  ?SURGEON:  Betha Loa, M.D. ?  ?ASSISTANT: Cindee Salt, MD ?  ?ANESTHESIA:  General ?  ?INTRAVENOUS FLUIDS:  Per anesthesia flow sheet. ?  ?ESTIMATED BLOOD LOSS:  Minimal. ?  ?COMPLICATIONS:  None. ?  ?SPECIMENS:  none ?  ?TOURNIQUET TIME:   ? ?Total Tourniquet Time Documented: ?Upper Arm (Right) - 38 minutes ?Total: Upper Arm (Right) - 38 minutes ? ?  ?DISPOSITION:  Stable to PACU. ?  ?INDICATIONS: 41 year old female status post small finger metacarpal neck fracture with stiffness of the MP joint in flexion.  She wishes to have joint release of the MP joint to try to improve her range of motion.  She has tried splinting and therapy without adequate improvement.  Risks, benefits and alternatives of surgery were discussed including the risks of blood loss, infection, damage to nerves, vessels, tendons, ligaments, bone for surgery, need for additional surgery, complications with wound healing, continued pain, stiffness, , recurrence.  She voiced understanding of these risks and elected to proceed. ? ?OPERATIVE COURSE:  After being identified preoperatively by myself,  the patient and I agreed on the procedure and site of the procedure.  The surgical site was marked.  Surgical consent had been signed. She was given IV antibiotics as preoperative antibiotic prophylaxis. She was transferred to the operating room and placed on the operating table in supine position with the Right upper extremity on an arm board.  General anesthesia was induced by the anesthesiologist.  Right upper extremity  was prepped and draped in normal sterile orthopedic fashion.  A surgical pause was performed between the surgeons, anesthesia, and operating room staff and all were in agreement as to the patient, procedure, and site of procedure.  Tourniquet at the proximal aspect of the extremity was inflated to 250 mmHg after exsanguination of the arm with an Esmarch bandage.  Incision was made at the dorsum of the small finger MP joint.  This was carried into the subcutaneous tissues by spreading technique.  There was scar in the subcutaneous tissues.  This was released.  There was scar underneath the extensor tendons which was also released.  The sagittal band on the ulnar side of the small finger was released to allow visualization of the MP joint.  The capsule was very thickened and there was scarring over top.  A Beaver blade was used to open the capsule in a transverse fashion.  The capsule was very thickened.  There was some scar underneath as well.  The finger was able to flex better but still not fully.  The collateral ligaments on the radial and ulnar side were released from the metacarpal head with a Beaver blade.  This provided improved flexion.  The freer elevator was used to elevate the volar plate on the palm side of the metacarpal.  The bony prominence identified on preoperative CT was identified and removed with the rongeurs.  The finger was able to flex into nearly full flexion.  At the terminal aspect of flexion the volar portion of the proximal phalanx would contact the metacarpal shaft and hinge forward though the finger did come into  full flexion.  The wound was copiously irrigated with sterile saline.  A single chromic suture was used to tack the ulnar collateral ligament back to the soft tissues at the ulnar side of the metacarpal to prevent it from retracting.  The sagittal band was repaired with a running 4-0 Mersilene suture.  The skin was closed with 4-0 nylon in a horizontal mattress fashion.  The  wound was injected with quarter percent plain Marcaine to aid in postoperative analgesia.  It was then dressed with sterile Xeroform 4 x 4's and wrapped with a Kerlix bandage.  A dorsal splint was placed including the long ring and small fingers with the MPs flexed.  This was wrapped with Kerlix and Ace bandage.  The tourniquet was deflated at 38 minutes.  Fingertips were pink with brisk capillary refill after deflation of tourniquet.  The operative  drapes were broken down.  The patient was awoken from anesthesia safely.  She was transferred back to the stretcher and taken to PACU in stable condition.  I will see her back in the office in 1 week for postoperative followup.  I will give her a prescription for Norco 5/325 1-2 tabs PO q6 hours prn pain, dispense #25. ? ? ?Betha Loa, MD ?Electronically signed, 03/24/21 ?

## 2021-03-24 NOTE — Anesthesia Procedure Notes (Signed)
Procedure Name: LMA Insertion ?Date/Time: 03/24/2021 10:52 AM ?Performed by: Alford Highland, CRNA ?Pre-anesthesia Checklist: Patient identified, Emergency Drugs available, Suction available and Patient being monitored ?Patient Re-evaluated:Patient Re-evaluated prior to induction ?Oxygen Delivery Method: Circle System Utilized ?Preoxygenation: Pre-oxygenation with 100% oxygen ?Induction Type: IV induction ?Ventilation: Mask ventilation without difficulty ?LMA: LMA inserted ?LMA Size: 4.0 ?Number of attempts: 1 ?Airway Equipment and Method: Bite block ?Placement Confirmation: positive ETCO2 ?Tube secured with: Tape ?Dental Injury: Teeth and Oropharynx as per pre-operative assessment  ? ? ? ? ?

## 2021-03-24 NOTE — Anesthesia Postprocedure Evaluation (Signed)
Anesthesia Post Note ? ?Patient: Destiny Ramirez ? ?Procedure(s) Performed: RIGHT SMALL FINGER METACARPOPHALANGEAL JOINT RELEASE (Right: Hand) ? ?  ? ?Patient location during evaluation: PACU ?Anesthesia Type: General ?Level of consciousness: awake ?Pain management: pain level controlled ?Vital Signs Assessment: post-procedure vital signs reviewed and stable ?Respiratory status: spontaneous breathing and respiratory function stable ?Cardiovascular status: stable ?Postop Assessment: no apparent nausea or vomiting ?Anesthetic complications: no ? ? ?No notable events documented. ? ?Last Vitals:  ?Vitals:  ? 03/24/21 1315 03/24/21 1325  ?BP: (!) 77/61   ?Pulse: (!) 57 (!) 52  ?Resp: 11 10  ?Temp:    ?SpO2: 96% 95%  ?  ?Last Pain:  ?Vitals:  ? 03/24/21 1325  ?TempSrc:   ?PainSc: 6   ? ? ?  ?  ?  ?  ?  ?  ? ?Mellody Dance ? ? ? ? ?

## 2021-03-24 NOTE — Anesthesia Preprocedure Evaluation (Addendum)
Anesthesia Evaluation  ?Patient identified by MRN, date of birth, ID band ?Patient awake ? ? ? ?Reviewed: ?Allergy & Precautions, NPO status , Patient's Chart, lab work & pertinent test results ? ?History of Anesthesia Complications ?Negative for: history of anesthetic complications ? ?Airway ?Mallampati: II ? ?TM Distance: >3 FB ?Neck ROM: Full ? ? ? Dental ? ?(+) Teeth Intact ?  ?Pulmonary ?neg pulmonary ROS,  ?  ?Pulmonary exam normal ? ? ? ? ? ? ? Cardiovascular ?negative cardio ROS ?Normal cardiovascular exam ? ? ?  ?Neuro/Psych ?negative neurological ROS ? negative psych ROS  ? GI/Hepatic ?negative GI ROS, Neg liver ROS,   ?Endo/Other  ?negative endocrine ROS ? Renal/GU ?negative Renal ROS  ?negative genitourinary ?  ?Musculoskeletal ?negative musculoskeletal ROS ?(+)  ? Abdominal ?  ?Peds ? Hematology ?negative hematology ROS ?(+)   ?Anesthesia Other Findings ?Left eyelid stitches and dressing in place from ptosis surgery on 03/21/21.  ? Reproductive/Obstetrics ? ?  ? ? ? ? ? ? ? ? ? ? ? ? ? ?  ?  ? ? ? ? ? ? ? ?Anesthesia Physical ? ?Anesthesia Plan ? ?ASA: 1 ? ?Anesthesia Plan: General  ? ?Post-op Pain Management:  Regional for Post-op pain  ? ?Induction: Intravenous ? ?PONV Risk Score and Plan: 2 ? ?Airway Management Planned: LMA ? ?Additional Equipment: None ? ?Intra-op Plan:  ? ?Post-operative Plan: Extubation in OR ? ?Informed Consent: I have reviewed the patients History and Physical, chart, labs and discussed the procedure including the risks, benefits and alternatives for the proposed anesthesia with the patient or authorized representative who has indicated his/her understanding and acceptance.  ? ? ? ? ? ?Plan Discussed with: Anesthesiologist, CRNA and Surgeon ? ?Anesthesia Plan Comments: (Patient not interested in a nerve block today. Had a recent eye surgery requiring eye care. Will plan on LMA and local by surgeon. Norton Blizzard, MD  ?)  ? ? ? ? ? ?Anesthesia Quick  Evaluation ? ?

## 2021-03-24 NOTE — Transfer of Care (Signed)
Immediate Anesthesia Transfer of Care Note ? ?Patient: Destiny Ramirez ? ?Procedure(s) Performed: RIGHT SMALL FINGER METACARPOPHALANGEAL JOINT RELEASE (Right: Hand) ? ?Patient Location: PACU ? ?Anesthesia Type:General ? ?Level of Consciousness: drowsy, patient cooperative and responds to stimulation ? ?Airway & Oxygen Therapy: Patient Spontanous Breathing and Patient connected to face mask oxygen ? ?Post-op Assessment: Report given to RN and Post -op Vital signs reviewed and stable ? ?Post vital signs: Reviewed and stable ? ?Last Vitals:  ?Vitals Value Taken Time  ?BP    ?Temp    ?Pulse    ?Resp 14 03/24/21 1152  ?SpO2    ?Vitals shown include unvalidated device data. ? ?Last Pain:  ?Vitals:  ? 03/24/21 0918  ?TempSrc: Oral  ?PainSc: 0-No pain  ?   ? ?Patients Stated Pain Goal: 4 (03/24/21 [id number]) ? ?Complications: No notable events documented. ?

## 2021-03-24 NOTE — Discharge Instructions (Addendum)
?  Post Anesthesia Home Care Instructions ? ?Activity: ?Get plenty of rest for the remainder of the day. A responsible individual must stay with you for 24 hours following the procedure.  ?For the next 24 hours, DO NOT: ?-Drive a car ?-Advertising copywriter ?-Drink alcoholic beverages ?-Take any medication unless instructed by your physician ?-Make any legal decisions or sign important papers. ? ?Meals: ?Start with liquid foods such as gelatin or soup. Progress to regular foods as tolerated. Avoid greasy, spicy, heavy foods. If nausea and/or vomiting occur, drink only clear liquids until the nausea and/or vomiting subsides. Call your physician if vomiting continues. ? ?Special Instructions/Symptoms: ?Your throat may feel dry or sore from the anesthesia or the breathing tube placed in your throat during surgery. If this causes discomfort, gargle with warm salt water. The discomfort should disappear within 24 hours. ? ?If you had a scopolamine patch placed behind your ear for the management of post- operative nausea and/or vomiting: ? ?1. The medication in the patch is effective for 72 hours, after which it should be removed.  Wrap patch in a tissue and discard in the trash. Wash hands thoroughly with soap and water. ?2. You may remove the patch earlier than 72 hours if you experience unpleasant side effects which may include dry mouth, dizziness or visual disturbances. ?3. Avoid touching the patch. Wash your hands with soap and water after contact with the patch. ?    ?NO ADDITIONAL TYLENOL UNTIL AFTER 1:30pm ? ?[facility] Instructions ?Hand Surgery ? ?Wound Care: ?Keep your hand elevated above the level of your heart.  Do not allow it to dangle by your side.  Keep the dressing dry and do not remove it unless your doctor advises you to do so.  He will usually change it at the time of your post-op visit.  Moving your fingers is advised to stimulate circulation but will depend on the site of your surgery.  If you have a  splint applied, your doctor will advise you regarding movement. ? ?Activity: ?Do not drive or operate machinery today.  Rest today and then you may return to your normal activity and work as indicated by your physician. ? ?Diet:  ?Drink liquids today or eat a light diet.  You may resume a regular diet tomorrow.   ? ?General expectations: ?Pain for two to three days. ?Fingers may become slightly swollen. ? ?Call your doctor if any of the following occur: ?Severe pain not relieved by pain medication. ?Elevated temperature. ?Dressing soaked with blood. ?Inability to move fingers. ?White or bluish color to fingers. ? ?

## 2021-03-24 NOTE — Op Note (Signed)
,  I assisted Surgeon(s) and Role: ?   Betha Loa, MD - Primary ?   Cindee Salt, MD - Assisting on the Procedure(s): ?RIGHT SMALL FINGER METACARPOPHALANGEAL JOINT RELEASE on 03/24/2021.  I provided assistance on this case as follows: setup, approach, identification of the joint, tenolysis of the extensor tendon, release of the collateral ligaments, removal of the exostosis, closure of the incision and application of the dressings and splint.,  ? ?Electronically signed by: Cindee Salt, MD ?Date: 03/24/2021 Time: 11:52 AM  ?

## 2021-03-24 NOTE — H&P (Signed)
?  Destiny Ramirez is an 41 y.o. female.   ?Chief Complaint: mp joint stiffness ?HPI: 41 yo female s/p right small finger metacarpal neck fracture with subsequent stiffness of mp joint.  She has tried splinting and therapy without adequate improvement.  She wishes to have right small finger MP joint release ? ?Allergies: No Known Allergies ? ?History reviewed. No pertinent past medical history. ? ?Past Surgical History:  ?Procedure Laterality Date  ? ANKLE SURGERY    ? CLOSED REDUCTION FINGER WITH PERCUTANEOUS PINNING Right 11/04/2019  ? Procedure: CLOSED REDUCTION PERCUTANEOUS PINNING RIGHT SMALL METACARPAL;  Surgeon: Betha Loa, MD;  Location: [facility];  Service: Orthopedics;  Laterality: Right;  ? ? ?Family History: ?Family History  ?Problem Relation Age of Onset  ? Diabetes Maternal Aunt   ? Diabetes Maternal Grandmother   ? ? ?Social History:  ? reports that she has never smoked. She has never used smokeless tobacco. She reports that she does not drink alcohol and does not use drugs. ? ?Medications: ?Medications Prior to Admission  ?Medication Sig Dispense Refill  ? ibuprofen (ADVIL) 200 MG tablet Take 200 mg by mouth every 6 (six) hours as needed.    ? HYDROcodone-acetaminophen (NORCO) 5-325 MG tablet 1-2 tabs po q6 hours prn pain 20 tablet 0  ? loratadine (CLARITIN) 10 MG tablet Take 10 mg by mouth daily.    ? ? ?Results for orders placed or performed during the hospital encounter of 03/24/21 (from the past 48 hour(s))  ?Pregnancy, urine POC     Status: None  ? Collection Time: 03/24/21  9:01 AM  ?Result Value Ref Range  ? Preg Test, Ur NEGATIVE NEGATIVE  ?  Comment:        ?THE SENSITIVITY OF THIS ?METHODOLOGY IS >24 mIU/mL ?  ? ? ?No results found. ? ? ? ?Blood pressure 112/78, pulse 67, temperature 97.6 ?F (36.4 ?C), temperature source Oral, resp. rate 18, height 5\' 7"  (1.702 m), weight 78.7 kg, last menstrual period 03/14/2021, SpO2 99 %. ? ?General appearance: alert, cooperative,  and appears stated age ?Head: Normocephalic, without obvious abnormality, atraumatic ?Neck: supple, symmetrical, trachea midline ?Extremities: Intact sensation and capillary refill all digits.  +epl/fpl/io.  No wounds. Stiffness in flexion of right small finger mp joint ?Pulses: 2+ and symmetric ?Skin: Skin color, texture, turgor normal. No rashes or lesions ?Neurologic: Grossly normal ?Incision/Wound: none ? ?Assessment/Plan ?Right small finger stiffness at mp joint after metacarpal neck fracture.  Plan right small finger mp joint release with removal of bony prominence.  Non operative and operative treatment options have been discussed with the patient and patient wishes to proceed with operative treatment. .rba  ? ?03/16/2021 ?03/24/2021, 10:25 AM ? ?

## 2022-02-22 ENCOUNTER — Other Ambulatory Visit: Payer: Self-pay | Admitting: Internal Medicine

## 2022-02-23 LAB — C. TRACHOMATIS/N. GONORRHOEAE RNA
C. trachomatis RNA, TMA: NOT DETECTED
N. gonorrhoeae RNA, TMA: NOT DETECTED

## 2022-05-04 ENCOUNTER — Encounter: Payer: Medicaid Other | Admitting: Student

## 2022-05-04 ENCOUNTER — Other Ambulatory Visit: Payer: Self-pay | Admitting: Internal Medicine

## 2022-05-04 DIAGNOSIS — Z1231 Encounter for screening mammogram for malignant neoplasm of breast: Secondary | ICD-10-CM

## 2022-05-08 ENCOUNTER — Ambulatory Visit
Admission: RE | Admit: 2022-05-08 | Discharge: 2022-05-08 | Disposition: A | Discharge status: Home / Self Care | Payer: Medicaid Other | Source: Ambulatory Visit | Attending: Internal Medicine | Admitting: Internal Medicine

## 2022-05-08 DIAGNOSIS — Z1231 Encounter for screening mammogram for malignant neoplasm of breast: Secondary | ICD-10-CM

## 2022-06-21 ENCOUNTER — Ambulatory Visit (INDEPENDENT_AMBULATORY_CARE_PROVIDER_SITE_OTHER): Payer: Medicaid Other | Admitting: Obstetrics & Gynecology

## 2022-06-21 ENCOUNTER — Other Ambulatory Visit (HOSPITAL_COMMUNITY)
Admission: RE | Admit: 2022-06-21 | Discharge: 2022-06-21 | Disposition: A | Discharge status: Home / Self Care | Payer: Medicaid Other | Source: Ambulatory Visit | Attending: Student | Admitting: Student

## 2022-06-21 ENCOUNTER — Encounter: Payer: Self-pay | Admitting: Obstetrics & Gynecology

## 2022-06-21 VITALS — BP 119/86 | HR 78 | Ht 67.0 in | Wt 175.0 lb

## 2022-06-21 DIAGNOSIS — Z01419 Encounter for gynecological examination (general) (routine) without abnormal findings: Secondary | ICD-10-CM | POA: Diagnosis not present

## 2022-06-21 NOTE — Progress Notes (Signed)
Patient ID: Destiny Ramirez, female   DOB: 12-10-1980, 42 y.o.   MRN: [id number]  Chief Complaint  Patient presents with   New Patient (Initial Visit)   Annual Exam    HPI Destiny Ramirez is a 42 y.o. female.  [id number] Patient's last menstrual period was 06/01/2022 (approximate). She has regular menses and uses no birth control. Pap test has been normal and last may have been 3 years ago. Mammogram nl 04/2022   HPI  History reviewed. No pertinent past medical history.  Past Surgical History:  Procedure Laterality Date   ANKLE SURGERY     CLOSED REDUCTION FINGER WITH PERCUTANEOUS PINNING Right 11/04/2019   Procedure: CLOSED REDUCTION PERCUTANEOUS PINNING RIGHT SMALL METACARPAL;  Surgeon: Betha Loa, MD;  Location: [facility];  Service: Orthopedics;  Laterality: Right;    Family History  Problem Relation Age of Onset   Diabetes Maternal Aunt    Diabetes Maternal Grandmother     Social History Social History   Tobacco Use   Smoking status: Never   Smokeless tobacco: Never  Vaping Use   Vaping Use: Never used  Substance Use Topics   Alcohol use: No    Alcohol/week: 0.0 standard drinks of alcohol    Comment: social   Drug use: No    No Known Allergies  Current Outpatient Medications  Medication Sig Dispense Refill   cetirizine (ZYRTEC) 10 MG chewable tablet Chew 10 mg by mouth daily.     ibuprofen (ADVIL) 200 MG tablet Take 200 mg by mouth every 6 (six) hours as needed.     HYDROcodone-acetaminophen (NORCO) 5-325 MG tablet 1-2 tabs po q6 hours prn pain 25 tablet 0   No current facility-administered medications for this visit.    Review of Systems Review of Systems  Constitutional: Negative.   All other systems reviewed and are negative.   Blood pressure 119/86, pulse 78, height 5\' 7"  (1.702 m), weight 175 lb (79.4 kg), last menstrual period 06/01/2022.  Physical Exam Physical Exam Vitals and nursing note reviewed. Exam conducted with  a chaperone present.  Constitutional:      Appearance: Normal appearance. She is not ill-appearing.  HENT:     Head: Normocephalic and atraumatic.  Eyes:     Pupils: Pupils are equal, round, and reactive to light.  Cardiovascular:     Rate and Rhythm: Normal rate.  Pulmonary:     Effort: Pulmonary effort is normal.  Abdominal:     General: Abdomen is flat.     Palpations: Abdomen is soft.  Genitourinary:    General: Normal vulva.     Exam position: Lithotomy position.     Vagina: Normal.     Cervix: Normal.     Uterus: Normal.      Adnexa: Right adnexa normal and left adnexa normal.  Musculoskeletal:        General: Normal range of motion.  Skin:    General: Skin is warm and dry.  Neurological:     General: No focal deficit present.  Psychiatric:        Mood and Affect: Mood normal.        Behavior: Behavior normal.     Data Reviewed Mammogram 04/2022  Assessment Well woman exam with routine gynecological exam - Plan: Cytology - PAP( [place])   Plan She request screen for BV and will f/u on pap result    Scheryl Darter 06/21/2022, 1:43 PM

## 2022-06-23 LAB — CYTOLOGY - PAP
Comment: NEGATIVE
Diagnosis: UNDETERMINED — AB
High risk HPV: NEGATIVE
